# Patient Record
Sex: Male | Born: 2000 | Race: White | Hispanic: No | Marital: Single | Smoking: Never smoker
Health system: Southern US, Community
[De-identification: ages and names within clinical notes are randomized; demographics above are authoritative.]

## PROBLEM LIST (undated history)

## (undated) DIAGNOSIS — F988 Other specified behavioral and emotional disorders with onset usually occurring in childhood and adolescence: Secondary | ICD-10-CM

## (undated) DIAGNOSIS — I471 Supraventricular tachycardia, unspecified: Secondary | ICD-10-CM

## (undated) DIAGNOSIS — R05 Cough: Secondary | ICD-10-CM

## (undated) DIAGNOSIS — J302 Other seasonal allergic rhinitis: Secondary | ICD-10-CM

## (undated) DIAGNOSIS — R7303 Prediabetes: Secondary | ICD-10-CM

## (undated) DIAGNOSIS — J352 Hypertrophy of adenoids: Secondary | ICD-10-CM

## (undated) DIAGNOSIS — J45909 Unspecified asthma, uncomplicated: Secondary | ICD-10-CM

## (undated) DIAGNOSIS — T783XXA Angioneurotic edema, initial encounter: Secondary | ICD-10-CM

## (undated) DIAGNOSIS — L309 Dermatitis, unspecified: Secondary | ICD-10-CM

## (undated) DIAGNOSIS — R0981 Nasal congestion: Secondary | ICD-10-CM

## (undated) HISTORY — PX: ADENOIDECTOMY: SUR15

## (undated) HISTORY — DX: Angioneurotic edema, initial encounter: T78.3XXA

## (undated) HISTORY — DX: Dermatitis, unspecified: L30.9

## (undated) HISTORY — DX: Prediabetes: R73.03

---
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
## 2022-02-16 DIAGNOSIS — K582 Mixed irritable bowel syndrome: Secondary | ICD-10-CM | POA: Diagnosis not present

## 2022-02-17 ENCOUNTER — Encounter: Payer: Self-pay | Admitting: Gastroenterology

## 2022-02-17 ENCOUNTER — Ambulatory Visit (INDEPENDENT_AMBULATORY_CARE_PROVIDER_SITE_OTHER): Payer: Medicaid Other | Admitting: Gastroenterology

## 2022-02-17 VITALS — BP 106/71 | HR 64 | Temp 97.6°F | Ht 73.0 in | Wt 320.3 lb

## 2022-02-17 DIAGNOSIS — K219 Gastro-esophageal reflux disease without esophagitis: Secondary | ICD-10-CM | POA: Diagnosis not present

## 2022-02-17 DIAGNOSIS — R198 Other specified symptoms and signs involving the digestive system and abdomen: Secondary | ICD-10-CM | POA: Diagnosis not present

## 2022-02-17 MED ORDER — PANTOPRAZOLE SODIUM 40 MG PO TBEC: 40.0000 mg | DELAYED_RELEASE_TABLET | Freq: Two times a day (BID) | ORAL | 3 refills | Status: DC

## 2022-02-17 NOTE — Progress Notes (Signed)
? ?GI Office Note   ? ?Referring Provider: Richrd Sox, MD ?Primary Care Physician:  Richrd Sox, MD ?Primary GI: Dr. Marletta Lor ? ?Date:  02/17/2022  ?ID:  Dylan Bush, DOB 2001/02/21, MRN [id number] ? ? ?Chief Complaint  ? ?Chief Complaint  ?Patient presents with  ? Abdominal Pain  ?  Constipation or diarrhea    ? ? ? ?History of Present Illness  ?Dylan Bush is a 21 y.o. male presenting today with a history of ADD, Anxiety, Asthma, SVT, and prediabetes presenting today with complaint of constipation and diarrhea.  ? ?Per review of PCP note, patient has suffered with social anxiety for which he is now being treated for with Buspar and fluoxetine. He has a documented history of gluten intolerance as a child as celiac panel was negative. PCP advised to try for better control of anxiety and then work on dietary and lifestyle changes.  ? ?Patient and mother both providing history.  Patient has had alternating constipation and diarrhea.  He reports that he has predominant diarrhea.  He was multiple times a day with stools being a Bristol 5 or 6.  He also has feeling of incomplete emptying.  He does seldom report some watery, Bristol 7 stools as well.  Reports that most days of the week he is going multiple times in a day, however twice a week he reports constipation not being able to go to the bathroom at all.  Stools are dark green or brown color.  Denies any hematemesis, melena or hematochezia.  He also has lower abdominal pain that sometimes improves with defecation.  He reports he has tried MiraLAX in the past but was never consistent with that.  He was given dicyclomine to take 3 times daily as needed for abdominal cramping by his PCP.  Appointment has been taking this 3 times daily every day since.  He reports his diet is not that great and it is hard to avoid gluten.  His celiac testing has been negative in the past but he has been suspected of being gluten intolerant.  He does report that he will  eat snacks at night including chips, ice cream, or cereal.  He states he mostly uses 2% milk but a lot of times ice cream is regular ice cream.  He reports his symptoms are getting in the way of his work, and his boss has been talking about possibly firing him due to staying in the bathroom too much. ? ?He also continues to complain of GERD symptoms.  He is currently on omeprazole 20 mg twice daily.  He reports it is hard to eat anything in the morning, he has a sour taste in his mouth and occasional vomiting related to the burning sensation in his throat (states he feels like lava).  Denies nausea or upper abdominal pain. ? ?Past Medical History:  ?Diagnosis Date  ? ADD (attention deficit disorder)   ? Adenoid hypertrophy 09/2013  ? Angio-edema   ? Asthma   ? daily and prn inhalers  ? Cough 10/11/2013  ? started antibiotic 10/04/2013 x 10 days  ? Eczema   ? 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 Prediabetes   ? Seasonal allergies   ? Stuffy nose 10/11/2013  ? SVT (supraventricular tachycardia) (HCC)   ? followed yearly by [facility]. Cardiology  ? ? ?Past Surgical History:  ?Procedure Laterality Date  ? ADENOIDECTOMY N/A 10/15/2013  ? Procedure: ADENOIDECTOMY;  Surgeon: Darletta Moll, MD;  Location: [facility];  Service: ENT;  Laterality: N/A;  ? ADENOIDECTOMY    ? ? ?Current Outpatient Medications  ?Medication Sig Dispense Refill  ? busPIRone (BUSPAR) 10 MG tablet Take 10 mg by mouth 3 (three) times daily.    ? dicyclomine (BENTYL) 20 MG tablet Take 20 mg by mouth 4 (four) times daily.    ? fexofenadine (ALLEGRA ALLERGY) 180 MG tablet Take 1 tablet (180 mg total) by mouth daily. 30 tablet 3  ? ibuprofen (ADVIL,MOTRIN) 400 MG tablet Take 1 tablet (400 mg total) by mouth every 6 (six) hours as needed. 30 tablet 0  ? montelukast (SINGULAIR) 10 MG tablet TAKE 1 TABLET BY MOUTH EVERY EVENING AT BEDTIME 30 tablet 0  ? pantoprazole (PROTONIX) 40 MG tablet Take 1 tablet (40 mg total) by mouth 2 (two)  times daily. 90 tablet 3  ? PROAIR HFA 108 (90 Base) MCG/ACT inhaler INHALE 1 TO 2 PUFFS INTO THE LUNGS EVERY 4 HOURS AS NEEDED FOR WHEEZING OR SHORTNESS OF BREATH 18 g 1  ? FLUoxetine (PROZAC) 10 MG capsule Take 10 mg by mouth daily.    ? fluticasone (FLONASE) 50 MCG/ACT nasal spray Place 2 sprays into both nostrils daily. (Patient not taking: Reported on 11/16/2020) 16 g 12  ? ?No current facility-administered medications for this visit.  ? ? ?Allergies as of 02/17/2022  ? (No Known Allergies)  ? ? ?Family History  ?Problem Relation Age of Onset  ? Diabetes Mother   ?     Type I  ? Asthma Mother   ?     as a child  ? Cancer Mother   ?     cervical  ? ADD / ADHD Mother   ? Allergic rhinitis Mother   ? Diabetes Brother   ?     Type I  ? ADD / ADHD Brother   ? Diabetes Maternal Aunt   ?     Type I  ? Diabetes Maternal Uncle   ?     Type I  ? Diabetes Paternal Uncle   ?     Type I  ? Diabetes Maternal Grandmother   ?     Type II  ? Hypertension Maternal Grandmother   ? Diabetes Maternal Grandfather   ?     Type II  ? Hypertension Maternal Grandfather   ? Thyroid disease Cousin   ? Angioedema Neg Hx   ? Atopy Neg Hx   ? Eczema Neg Hx   ? Immunodeficiency Neg Hx   ? Urticaria Neg Hx   ? ? ?Social History  ? ?Socioeconomic History  ? Marital status: Single  ?  Spouse name: Not on file  ? Number of children: Not on file  ? Years of education: Not on file  ? Highest education level: Not on file  ?Occupational History  ? Not on file  ?Tobacco Use  ? Smoking status: Never  ?  Passive exposure: Yes  ? Smokeless tobacco: Never  ? Tobacco comments:  ?  mother smokes outside and in the car with pt.  ?Vaping Use  ? Vaping Use: Never used  ?Substance and Sexual Activity  ? Alcohol use: No  ? Drug use: No  ? Sexual activity: Never  ?Other Topics Concern  ? Not on file  ?Social History Narrative  ? Lives with mom and 2 older brothers. 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  ? ?Social Determinants of Health   ? ?  Financial Resource Strain: Not on file  ?Food Insecurity: Not on file  ?Transportation Needs: Not on file  ?Physical Activity: Not on file  ?Stress: Not on file  ?Social Connections: Not on file  ? ? ? ?Review of Systems  ? ?Gen: Denies fever, chills, anorexia. Denies fatigue, weakness, weight loss.  ?CV: Denies chest pain, palpitations, syncope, peripheral edema, and claudication. ?Resp: Denies dyspnea at rest, cough, wheezing, coughing up blood, and pleurisy. ?GI: see HPI ?Derm: Denies rash, itching, dry skin ?Psych: Denies depression, anxiety, memory loss, confusion. No homicidal or suicidal ideation.  ?Heme: Denies bruising, bleeding, and enlarged lymph nodes. ? ? ?Physical Exam  ? ?BP 106/71   Pulse 64   Temp 97.6 ?F (36.4 ?C) (Temporal)   Ht 6\' 1"  (1.854 m)   Wt (!) 320 lb 4.8 oz (145.3 kg)   BMI 42.26 kg/m?  ? ?General:   Alert and oriented. No distress noted. Pleasant and cooperative.  ?Head:  Normocephalic and atraumatic. ?Eyes:  Conjuctiva clear without scleral icterus. ?Mouth:  Oral mucosa pink and moist. Good dentition. No lesions. ?Lungs:  Clear to auscultation bilaterally. No wheezes, rales, or rhonchi. No distress.  ?Heart:  S1, S2 present without murmurs appreciated.  ?Abdomen:  +BS, soft, mild TTP to lower abdomen, non-distended. No rebound or guarding. No HSM or masses noted. ?Rectal: deferred ?Msk:  Symmetrical without gross deformities. Normal posture. ?Extremities:  Without edema. ?Neurologic:  Alert and  oriented x4 ?Psych:  Alert and cooperative. Normal mood and affect. ? ? ?Assessment  ?Dylan Bush is a 21 y.o. male with a history of ADD, Anxiety, Asthma, SVT, and prediabetes presenting today with GERD and alternating constipation and diarrhea with associated abdominal pain.  ? ?Alternating constipation and diarrhea: Symptoms appear to be predominantly diarrhea based on report.  Reports Bristol 5/6 stools mostly.  Although he reports that this is consistency of his stools,  suspect constipation with overflow diarrhea given incomplete emptying along with abdominal bloating and lower abdominal pain.  Given the variation of his symptoms, suspect he likely has IBS in the setting of his anxiet

## 2022-02-17 NOTE — Patient Instructions (Addendum)
I am attaching education to your visit summary regarding diet changes and things to do as discussed related to acid reflux and IBS.  As discussed I want you to keep a dietary log along with your symptoms so we can identify any food triggers. ? ?This weekend I went to to eat more of a bland diet, and take MiraLAX beginning Friday evening (1 capful), take 1 capful again Saturday morning.  You may have associated abdominal cramping.  Do not take Bentyl.  If you have more than 2-3 large loose/watery bowel movements you may skip the MiraLAX on Sunday.  If diarrhea ensues you may begin taking the Bentyl again but decreased to twice a day rather than 3 times a day for abdominal cramping.  Also begin taking Benefiber 2 teaspoons daily for the next couple weeks and let me know how you are doing. ? ?I sent in pantoprazole 40 mg twice daily, 30 minutes before meals.  If for some reason this medication not covered by her insurance or so expensive, please call the office and let me know and we will try different medication. ? ?It was a pleasure to see you today. I want to create trusting relationships with patients. If you receive a survey regarding your visit,  I greatly appreciate you taking time to fill this out on paper or through your MyChart. I value your feedback. ? ?Brooke Bonito, MSN, FNP-BC, AGACNP-BC ?[facility] ? ? ?

## 2022-03-08 ENCOUNTER — Encounter: Payer: Self-pay | Admitting: Gastroenterology

## 2022-03-08 ENCOUNTER — Ambulatory Visit (INDEPENDENT_AMBULATORY_CARE_PROVIDER_SITE_OTHER): Payer: Medicaid Other | Admitting: Gastroenterology

## 2022-03-08 VITALS — BP 125/77 | HR 85 | Temp 97.0°F | Ht 74.0 in | Wt 310.2 lb

## 2022-03-08 DIAGNOSIS — K580 Irritable bowel syndrome with diarrhea: Secondary | ICD-10-CM | POA: Diagnosis not present

## 2022-03-08 DIAGNOSIS — R197 Diarrhea, unspecified: Secondary | ICD-10-CM

## 2022-03-08 DIAGNOSIS — R1084 Generalized abdominal pain: Secondary | ICD-10-CM | POA: Diagnosis not present

## 2022-03-08 DIAGNOSIS — K219 Gastro-esophageal reflux disease without esophagitis: Secondary | ICD-10-CM

## 2022-03-08 DIAGNOSIS — R109 Unspecified abdominal pain: Secondary | ICD-10-CM | POA: Insufficient documentation

## 2022-03-08 MED ORDER — OMEPRAZOLE 20 MG PO CPDR: 20.0000 mg | DELAYED_RELEASE_CAPSULE | Freq: Two times a day (BID) | ORAL | 3 refills | Status: DC

## 2022-03-08 MED ORDER — HYOSCYAMINE SULFATE 0.125 MG PO TABS: 0.1250 mg | ORAL_TABLET | Freq: Three times a day (TID) | ORAL | 3 refills | Status: DC

## 2022-03-08 NOTE — Progress Notes (Signed)
GI Office Note    Referring Provider: Richrd SoxJohnson, Quan T, MD Primary Care Physician:  Roe RutherfordKeatts, Courtney, NP  Primary Gastroenterologist:Charles K. Marletta Lorarver, DO   Chief Complaint   Chief Complaint  Patient presents with   diarrhea/constipation    Had diarrhea all last week and now has constipation, lower abd cramps at times when constipated. Occas nausea/vomiting    Gastroesophageal Reflux    Wants to go back to omperazole. Worked better     History of Present Illness   Dylan Bush is a 21 y.o. male presenting today for follow-up. He presents with his mother, who [facility].  Patient was last seen February 17, 2022.  Past medical history significant for SVT (followed at [facility] previously but no longer on medication), prediabetes, anxiety/social anxiety, ADD, GERD, suspected IBS.  Previously followed by pediatric GI Dr. Migdalia Dkudra, last seen via video visit in 12/2020. Prior to that [facility]. He has aged out of peds. Suspected IBS-D given "no red flags and negative celiac panel." Recommended obtaining prescreening EKG to rule out prolonged QTC and possibly start low dose Elavil.   Patient complains of ongoing issues with mostly diarrhea, abdominal cramps. Rarely has constipation. No "normal" stools. He has had a lot of stomach issues since age 21. He has missed quite a bit of work due to symptoms. Sometimes gets stuck in the bathroom for awhile due to diarrhea. He had diarrhea all last week. BM 5-10 times per day. [place] 6-7. He has seen [facility] on toilet tissue before. No melena. Rare normal stools. Sometimes he feels constipated. Gets abdominal cramping. Happened couple of nights ago. He will feel urge for BM and when he goes on a little passes and feels incomplete.on occasion has tried Miralax, took several days in a row and did not feel like it helped. Made it "harder" to pass the stool because stool became pasty. Bentyl helps some with abdominal  cramping but not loose stool. Abdominal cramping resolves with BM. Imodium does not help with diarrhea. He has avoided dairy and gluten and does not find this helpful. Notes increased diarrhea with anxiety. Does have a postprandial component. He is on Buspar for social anxiety. 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 His two brothers work there also. He does not want to lose his job although he does say they work well with him and just want him to feel better. He has not used FMLA. States PCP did labs and told him he has gluten "sensitivity" and advised to go gluten free. He did this for awhile and it didn't help. States he had overall cut back on gluten, uses gluten free pasta but still eats regular bread due to expense.   Continues to have issues with acid reflux. Felt like omeprazole was better than pantoprazole. He was switched at last ov. He has had some regurgitation and burning. Some dysphagia. Denies ASA powders.     October 2016: TTG IgA 1, gliadin IgG 7, gliadin IgA 7 all negative. October 2019: IgA 201, TTG IgA less than 1.23 January 2021: Antigliadin IgG weakly positive at 22, ITG/DGP screen negative, ANA negative    Medications   Current Outpatient Medications  Medication Sig Dispense Refill   busPIRone (BUSPAR) 10 MG tablet Take 10 mg by mouth 3 (three) times daily.     dicyclomine (BENTYL) 20 MG tablet Take 20 mg by mouth in the  morning, at noon, and at bedtime.     fexofenadine (ALLEGRA ALLERGY) 180 MG tablet Take 1 tablet (180 mg total) by mouth daily. 30 tablet 3   FLUoxetine (PROZAC) 10 MG capsule Take 10 mg by mouth daily.     ibuprofen (ADVIL,MOTRIN) 400 MG tablet Take 1 tablet (400 mg total) by mouth every 6 (six) hours as needed. 30 tablet 0   montelukast (SINGULAIR) 10 MG tablet TAKE 1 TABLET BY MOUTH EVERY EVENING AT BEDTIME 30 tablet 0   pantoprazole (PROTONIX) 40 MG tablet Take 1 tablet (40 mg total) by mouth 2 (two) times daily. 90 tablet 3    PROAIR HFA 108 (90 Base) MCG/ACT inhaler INHALE 1 TO 2 PUFFS INTO THE LUNGS EVERY 4 HOURS AS NEEDED FOR WHEEZING OR SHORTNESS OF BREATH 18 g 1   fluticasone (FLONASE) 50 MCG/ACT nasal spray Place 2 sprays into both nostrils daily. (Patient not taking: Reported on 11/16/2020) 16 g 12   No current facility-administered medications for this visit.    Allergies   Allergies as of 03/08/2022   (No Known Allergies)      Review of Systems   General: Negative for anorexia, weight loss, fever, chills, fatigue, weakness. Eyes: Negative for vision changes.  ENT: Negative for hoarseness, difficulty swallowing , nasal congestion. CV: Negative for chest pain, angina, palpitations, dyspnea on exertion, peripheral edema.  Respiratory: Negative for dyspnea at rest, dyspnea on exertion, cough, sputum, wheezing.  GI: See history of present illness. GU:  Negative for dysuria, hematuria, urinary incontinence, urinary frequency, nocturnal urination.  MS: Negative for joint pain, low back pain.  Derm: Negative for rash or itching.  Neuro: Negative for weakness, abnormal sensation, seizure, frequent headaches, memory loss,  confusion.  Psych: Negative for depression, suicidal ideation, hallucinations. +anxiety Endo: Negative for unusual weight change.  Heme: Negative for bruising or bleeding. Allergy: Negative for rash or hives.  Physical Exam   BP 125/77   Pulse 85   Temp (!) 97 F (36.1 C)   Ht 6\' 2"  (1.88 m)   Wt (!) 310 lb 3.2 oz (140.7 kg)   BMI 39.83 kg/m    General: obese, young male, pleasant, well-developed in no acute distress. Accompanied by mother.  Head: Normocephalic, atraumatic.   Eyes: Conjunctiva pink, no icterus. Mouth: Oropharyngeal mucosa moist and pink , no lesions erythema or exudate. Neck: Supple without thyromegaly, masses, or lymphadenopathy.  Lungs: Clear to auscultation bilaterally.  Heart: Regular rate and rhythm, no murmurs rubs or gallops.  Abdomen: Bowel sounds  are normal, nontender, nondistended, no hepatosplenomegaly or masses,  no abdominal bruits or hernia, no rebound or guarding. Limited by body habitus. Rectal: not performed  Extremities: No lower extremity edema. No clubbing or deformities.  Neuro: Alert and oriented x 4 , grossly normal neurologically.  Skin: Warm and dry, no rash or jaundice.   Psych: Alert and cooperative, normal mood and affect.  Labs   Lab Results  Component Value Date   TSH 1.950 04/19/2019   Lab Results  Component Value Date   HGBA1C 5.8 (H) 04/19/2019   01/2021: WBC 5100, Hgb 14.3, Platelets 325000, bun 8, cre 0.81, na 140, alb 5, tbili 0.3, AP 108, AST 18, ALT 19  Imaging Studies   No results found.  Assessment   GERD: Felt like he did better on omeprazole than pantoprazole.  He would like to go back on the medication.  Prescription for omeprazole 20 mg twice daily before breakfast and evening meal.  Reinforced  antireflux measures.  He has room for improvement with regards to his diet.  Weight loss would also be helpful for better management of his reflux.  He has some vague dysphagia symptoms.  May improve with changing PPI.  No prior upper endoscopy.  May need to consider in the near future if symptoms do not settle down.  Diarrhea: Patient describes alternating constipation and diarrhea, predominantly diarrhea.  Has had bowel issues for 7 or 8 years at least.  States he has tried dairy free, gluten-free diets without improvement.  Dicyclomine has helped with abdominal cramping but not really with the diarrhea.  No response to Imodium.  Notes a postprandial component as well as related to stress.  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  We offered to fill out FMLA papers if needed or provide work notes but he declined.  There has been concern for gluten sensitivity based on lab work.  I have reviewed labs.  In 2016 when he was 21 years old, TTG  IgA, gliadin IgG, gliadin IgA were all negative.  In 2019 and age 52, TTG IgA and serum IgA were unremarkable.  Last year TTG/DGP screen was negative. Antigliadin IgG was weakly positive although this testing is not recommended to be performed for celiac screening because of high rate of false positivity, 15 to 20%.  Based on labs and lack of symptoms such as malabsorption, it is unlikely that he has celiac disease.  IBD is less likely as well although we will obtain inflammatory markers for screening purposes.  He likely has IBS.  Will not rule out the need for colonoscopy to complete work-up pending lab results.   PLAN   Start omeprazole 20 mg twice daily before first and evening meal Stop Bentyl, start Levsin before meals and at bedtime. Start Benefiber 2 teaspoons daily, 3 to 4 g of fiber daily from supplement. Consume high-fiber foods. Limit dairy products. Labs for CMET, CBC, sed rate, CRP, TTG IgA, IgA Depending on clinical response, he may need upper endoscopy to further evaluate GERD/vague dysphagia and possibly a colonoscopy if no improvement in symptoms. Could consider use of Viberzi now that he is 20.  We will touch base with patient after labs available.  Leanna Battles. Melvyn Neth, MHS, PA-C [facility]

## 2022-03-08 NOTE — Patient Instructions (Signed)
Stop pantoprazole, start omeprazole 20mg  twice daily before breakfast and evening meal. ?Stop bentyl, start levsin. Dissolve one under tongue 30 minutes before meals and at bedtime.  ?Start Benefiber 2 teaspoons daily. You can also use fiber chewable or gummy. Try to get 3-4 grams of fiber daily from supplement. ?Get labs done. We will be in touch with results and next step. ?

## 2022-03-15 DIAGNOSIS — K219 Gastro-esophageal reflux disease without esophagitis: Secondary | ICD-10-CM | POA: Diagnosis not present

## 2022-03-15 DIAGNOSIS — R1084 Generalized abdominal pain: Secondary | ICD-10-CM | POA: Diagnosis not present

## 2022-03-15 DIAGNOSIS — R197 Diarrhea, unspecified: Secondary | ICD-10-CM | POA: Diagnosis not present

## 2022-03-18 LAB — COMPREHENSIVE METABOLIC PANEL
ALT: 20 IU/L (ref 0–44)
AST: 16 IU/L (ref 0–40)
Albumin/Globulin Ratio: 1.6 (ref 1.2–2.2)
Albumin: 4.2 g/dL (ref 4.1–5.2)
Alkaline Phosphatase: 109 IU/L (ref 51–125)
BUN/Creatinine Ratio: 9 (ref 9–20)
BUN: 7 mg/dL (ref 6–20)
Bilirubin Total: <0.2 mg/dL (ref 0.0–1.2)
CO2: 23 mmol/L (ref 20–29)
Calcium: 9.7 mg/dL (ref 8.7–10.2)
Chloride: 105 mmol/L (ref 96–106)
Creatinine, Ser: 0.76 mg/dL (ref 0.76–1.27)
Globulin, Total: 2.7 g/dL (ref 1.5–4.5)
Glucose: 97 mg/dL (ref 70–99)
Potassium: 4.8 mmol/L (ref 3.5–5.2)
Sodium: 141 mmol/L (ref 134–144)
Total Protein: 6.9 g/dL (ref 6.0–8.5)
eGFR: 134 mL/min/{1.73_m2} (ref >59)

## 2022-03-18 LAB — CBC WITH DIFFERENTIAL/PLATELET
Basophils Absolute: 0.1 10*3/uL (ref 0.0–0.2)
Basos: 1 %
EOS (ABSOLUTE): 0.4 10*3/uL (ref 0.0–0.4)
Eos: 5 %
Hematocrit: 41.7 % (ref 37.5–51.0)
Hemoglobin: 13.7 g/dL (ref 13.0–17.7)
Immature Grans (Abs): 0 10*3/uL (ref 0.0–0.1)
Immature Granulocytes: 0 %
Lymphocytes Absolute: 2.3 10*3/uL (ref 0.7–3.1)
Lymphs: 31 %
MCH: 26.9 pg (ref 26.6–33.0)
MCHC: 32.9 g/dL (ref 31.5–35.7)
MCV: 82 fL (ref 79–97)
Monocytes Absolute: 0.5 10*3/uL (ref 0.1–0.9)
Monocytes: 7 %
Neutrophils Absolute: 4.2 10*3/uL (ref 1.4–7.0)
Neutrophils: 56 %
Platelets: 350 10*3/uL (ref 150–450)
RBC: 5.09 x10E6/uL (ref 4.14–5.80)
RDW: 15 % (ref 11.6–15.4)
WBC: 7.5 10*3/uL (ref 3.4–10.8)

## 2022-03-18 LAB — SEDIMENTATION RATE: Sed Rate: 16 mm/hr — ABNORMAL HIGH (ref 0–15)

## 2022-03-18 LAB — TISSUE TRANSGLUTAMINASE, IGA: Transglutaminase IgA: <2 U/mL (ref 0–3)

## 2022-03-18 LAB — C-REACTIVE PROTEIN: CRP: 4 mg/L (ref 0–10)

## 2022-03-18 LAB — IGA: IgA/Immunoglobulin A, Serum: 222 mg/dL (ref 90–386)

## 2022-03-31 ENCOUNTER — Telehealth: Payer: Self-pay

## 2022-03-31 NOTE — Telephone Encounter (Signed)
Pt's mother called wanting to know pt's lab results, states that they are not showing in myChart. Results are in Epic.

## 2022-04-01 NOTE — Telephone Encounter (Signed)
Mychart message sent to adult patient. Should show up in [facility] mychart. See result note.

## 2022-04-05 ENCOUNTER — Telehealth: Payer: Self-pay

## 2022-04-05 DIAGNOSIS — R197 Diarrhea, unspecified: Secondary | ICD-10-CM

## 2022-04-05 DIAGNOSIS — K580 Irritable bowel syndrome with diarrhea: Secondary | ICD-10-CM

## 2022-04-05 MED ORDER — VIBERZI 100 MG PO TABS: 100.0000 mg | ORAL_TABLET | Freq: Two times a day (BID) | ORAL | 2 refills | Status: DC

## 2022-04-05 NOTE — Addendum Note (Signed)
Addended by: Aida Raider on: 04/05/2022 04:27 PM   Modules accepted: Orders

## 2022-04-05 NOTE — Telephone Encounter (Signed)
Mother called stating that the pharmacy did not have viberzi in stock and states that [facility] does, is requesting that the prescription be sent there.

## 2022-04-05 NOTE — Telephone Encounter (Signed)
Prescription sent to [facility] in [place].

## 2022-04-05 NOTE — Addendum Note (Signed)
Addended by: Aida Raider on: 04/05/2022 11:02 AM   Modules accepted: Orders

## 2022-04-06 ENCOUNTER — Telehealth: Payer: Self-pay

## 2022-04-06 NOTE — Telephone Encounter (Signed)
PA for Viberzi 100 MG tablets was approved from 04/06/2022 through 04/07/2023. Approval letter to be scanned into patient's chart.

## 2022-04-22 ENCOUNTER — Encounter: Payer: Self-pay | Admitting: Internal Medicine

## 2022-04-29 ENCOUNTER — Encounter: Payer: Self-pay | Admitting: Gastroenterology

## 2022-04-29 ENCOUNTER — Telehealth: Payer: Self-pay

## 2022-04-29 ENCOUNTER — Telehealth (INDEPENDENT_AMBULATORY_CARE_PROVIDER_SITE_OTHER): Payer: Medicaid Other | Admitting: Gastroenterology

## 2022-04-29 ENCOUNTER — Telehealth: Payer: Self-pay | Admitting: Gastroenterology

## 2022-04-29 VITALS — Ht 73.0 in | Wt 295.0 lb

## 2022-04-29 DIAGNOSIS — K580 Irritable bowel syndrome with diarrhea: Secondary | ICD-10-CM

## 2022-04-29 NOTE — Telephone Encounter (Signed)
Dylan Bush, you are scheduled for a virtual visit with your provider today.  Just as we do with appointments in the office, we must obtain your consent to participate.  Your consent will be active for this visit and any virtual visit you may have with one of our providers in the next 365 days.  If you have a MyChart account, I can also send a copy of this consent to you electronically.  All virtual visits are billed to your insurance company just like a traditional visit in the office.  As this is a virtual visit, video technology does not allow for your provider to perform a traditional examination.  This may limit your provider's ability to fully assess your condition.  If your provider identifies any concerns that need to be evaluated in person or the need to arrange testing such as labs, EKG, etc, we will make arrangements to do so.  Although advances in technology are sophisticated, we cannot ensure that it will always work on either your end or our end.  If the connection with a video visit is poor, we may have to switch to a telephone visit.  With either a video or telephone visit, we are not always able to ensure that we have a secure connection.   I need to obtain your verbal consent now.   Are you willing to proceed with your visit today? Patient consented.

## 2022-04-29 NOTE — Telephone Encounter (Addendum)
Tried to connect with patient at 10:35 but patient never got onto virtual visit. Called number provided, Mom's number. She said she called and cancelled appt because he had to go to the bathroom and could not come out to talk. Appt was at 10:00 and she called to cancel at 10:35. I offered to complete appt now or make another appt for Monday, in person or virtual but she said she would have to call back. Per mom, he had been taking viberzi for two weeks and no improvement. Also scared of side effects. He will likely need a colonoscopy +/- egd (depending on how his reflux is going). Would advise in person or virtual appt to discuss and she said she would let him know and that she agrees with plan.

## 2022-04-29 NOTE — Progress Notes (Signed)
Tried to connect with patient but never got onto virtual visit. Called number provided, Mom's number. She said she called and cancelled appt because he had to go to the bathroom and could not come out to talk. I offered to make another appt for Monday, in person or virtual but she said she would have to call back. Per mom, he had been taking viberzi for two weeks and no improvement. Also scared of side effects. He will likely need a colonoscopy +/- egd (depending on how his reflux is going). Would advise in person or virtual appt to discuss and she said she would let him know and that she agrees with plan.

## 2022-05-03 ENCOUNTER — Telehealth (INDEPENDENT_AMBULATORY_CARE_PROVIDER_SITE_OTHER): Payer: Medicaid Other | Admitting: Gastroenterology

## 2022-05-03 ENCOUNTER — Telehealth: Payer: Self-pay | Admitting: *Deleted

## 2022-05-03 ENCOUNTER — Encounter: Payer: Self-pay | Admitting: Gastroenterology

## 2022-05-03 VITALS — Ht 73.0 in | Wt 295.0 lb

## 2022-05-03 DIAGNOSIS — R197 Diarrhea, unspecified: Secondary | ICD-10-CM

## 2022-05-03 DIAGNOSIS — K219 Gastro-esophageal reflux disease without esophagitis: Secondary | ICD-10-CM

## 2022-05-03 MED ORDER — CLENPIQ 10-3.5-12 MG-GM -GM/175ML PO SOLN: 1.0000 | ORAL | 0 refills | Status: DC

## 2022-05-03 NOTE — Progress Notes (Signed)
Primary Care Physician:  Roe Rutherford, NP Primary GI:  Hennie Duos. Marletta Lor, DO Patient Location: Home Provider Location: [facility] office  Reason for Visit:  Chief Complaint  Patient presents with   Diarrhea    Persons present on the virtual encounter, with roles: Patient, myself (provider), patient's mother Verdie Mosher, CMA (updated meds and allergies)  Total time (minutes) spent on medical discussion: 15 minutes  Due to COVID-19, visit was conducted using Mychart video method.  Visit was requested by patient.  Virtual Visit via Mychart video  I connected with Dylan Bush on 05/03/22 at 11:30 AM EDT by Mychart video and verified that I am speaking with the correct person using two identifiers.   I discussed the limitations, risks, security and privacy concerns of performing an evaluation and management service by telephone/video and the availability of in person appointments. I also discussed with the patient that there may be a patient responsible charge related to this service. The patient expressed understanding and agreed to proceed.   HPI:   Dylan Bush is a 21 y.o. male who presents for virtual visit regarding follow-up.  He was seen in the office in May 2023.  His past medical history significant for SVT, prediabetes, anxiety/social anxiety, ADD, GERD, suspected IBS.  Previously followed by pediatric GI, Dr. Migdalia Dk, last seen via video visit in 12/2020. Prior to that seen by pediatric GI at [facility]. He has aged out of peds. Pediatric GI suspected IBS-D given "no red flags and negative celiac panel." Recommended obtaining prescreening EKG to rule out prolonged QTC and possibly start low dose Elavil.   October 2016: TTG IgA 1, gliadin IgG 7, gliadin IgA 7 all negative. October 2019: IgA 201, TTG IgA less than 1.23 January 2021: Antigliadin IgG weakly positive at 22, ITG/DGP screen negative, ANA negative. May 2023: TTG IgA less than 2, IgA 222, CRP 4, sed  rate 16, white blood cell count 7500, hemoglobin 13.7, platelets 350,000, LFTs normal.  At last office visit his pantoprazole was switched to omeprazole 20 mg twice daily, he felt that previously omeprazole worked better.  Bentyl was stopped, Levsin started.  We added Benefiber as well.  He called in with persistent complaints of diarrhea and abdominal cramping.  He was started on Viberzi 100 mg twice daily on June 14.    Today:  Patient states he has been out of work for four weeks on medical leave. States his job took him out. He was supposed to go back to work today but he is not sure if he can. He did not find bentyl of levsin very effective. Bentyl did help with cramps but not the diarrhea. He does have postprandial component to his symptoms. Abdominal cramping improves after diarrhea. We started him on Viberzi, he states he took it for about a week. He is really worried about the side effects. He did not some improvement. His heartburn is doing alot better. No dysphagia. Occasional nausea. No vomiting. His typical days starts with a lot of abdominal cramping and diarrhea. Mornings and evenings are the worse. Sometimes wakes up due to symptoms. Having 5-10 loose stools daily on bad days. Gets stuck in the bathroom for awhile. Hemorrhoids flare with the diarrhea. Today he has had a lot of cramping and feels like he can't have a bowel movement.     Current Outpatient Medications  Medication Sig Dispense Refill   busPIRone (BUSPAR) 10 MG tablet Take 10 mg by mouth  3 (three) times daily.     Eluxadoline (VIBERZI) 100 MG TABS Take 1 tablet (100 mg total) by mouth in the morning and at bedtime. (Patient not taking: Reported on 04/29/2022) 60 tablet 2   fexofenadine (ALLEGRA ALLERGY) 180 MG tablet Take 1 tablet (180 mg total) by mouth daily. 30 tablet 3   FLUoxetine (PROZAC) 10 MG capsule Take 10 mg by mouth daily.     ibuprofen (ADVIL,MOTRIN) 400 MG tablet Take 1 tablet (400 mg total) by mouth every 6  (six) hours as needed. 30 tablet 0   montelukast (SINGULAIR) 10 MG tablet TAKE 1 TABLET BY MOUTH EVERY EVENING AT BEDTIME 30 tablet 0   omeprazole (PRILOSEC) 20 MG capsule Take 1 capsule (20 mg total) by mouth 2 (two) times daily before a meal. 60 capsule 3   PROAIR HFA 108 (90 Base) MCG/ACT inhaler INHALE 1 TO 2 PUFFS INTO THE LUNGS EVERY 4 HOURS AS NEEDED FOR WHEEZING OR SHORTNESS OF BREATH 18 g 1   No current facility-administered medications for this visit.    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 Cardiology    Past Surgical History:  Procedure Laterality Date   ADENOIDECTOMY N/A 10/15/2013   Procedure: ADENOIDECTOMY;  Surgeon: Darletta Moll, MD;  Location: [facility];  Service: ENT;  Laterality: N/A;   ADENOIDECTOMY      Family History  Problem Relation Age of Onset   Diabetes Mother        Type I   Asthma Mother        as a child   Cancer Mother        cervical   ADD / ADHD Mother    Allergic rhinitis Mother    Diabetes Brother        Type I   ADD / ADHD Brother    Diabetes Maternal Aunt        Type I   Diabetes Maternal Uncle        Type I   Diabetes Paternal Uncle        Type I   Diabetes Maternal Grandmother        Type II   Hypertension Maternal Grandmother    Diabetes Maternal Grandfather        Type II   Hypertension Maternal Grandfather    Thyroid disease Cousin    Angioedema Neg Hx    Atopy Neg Hx    Eczema Neg Hx    Immunodeficiency Neg Hx    Urticaria Neg Hx     Social History   Socioeconomic History   Marital status: Single    Spouse name: Not on file    Number of children: Not on file   Years of education: Not on file   Highest education level: Not on file  Occupational History   Not on file  Tobacco Use   Smoking status: Never    Passive exposure: Yes   Smokeless tobacco: Never   Tobacco comments:    mother smokes outside and in the car with  pt.  Vaping Use   Vaping Use: Never used  Substance and Sexual Activity   Alcohol use: No   Drug use: No   Sexual activity: Never  Other Topics Concern   Not on file  Social History Narrative   Lives with mom and 2 older brothers. 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   Social Determinants of Health   Financial Resource Strain: Not on file  Food Insecurity: Not on file  Transportation Needs: Not on file  Physical Activity: Not on file  Stress: Not on file  Social Connections: Not on file  Intimate Partner Violence: Not on file      ROS:  General: Negative for anorexia, weight loss, fever, chills, fatigue, weakness. Eyes: Negative for vision changes.  ENT: Negative for hoarseness, difficulty swallowing, nasal congestion. CV: Negative for chest pain, angina, palpitations, dyspnea on exertion, peripheral edema.  Respiratory: Negative for dyspnea at rest, dyspnea on exertion, cough, sputum, wheezing.  GI: See history of present illness. GU:  Negative for dysuria, hematuria, urinary incontinence, urinary frequency, nocturnal urination.  MS: Negative for joint pain, low back pain.  Derm: Negative for rash or itching.  Neuro: Negative for weakness, abnormal sensation, seizure, frequent headaches, memory loss, confusion.  Psych: Negative for  depression, suicidal ideation, hallucinations. +anxiety Endo: Negative for unusual weight change.  Heme: Negative for bruising or bleeding. Allergy: Negative for rash or hives.   Observations/Objective: Self reported weight of 295 pounds.  Recorded weight in office 310 pounds in 02/2022. Present with his mother. Looks to  mother for assistance in answering some questions but otherwise provides majority of his history. Looks comfortable. Good color. No SOB or distress. Appropriate, alert and oriented.  Assessment and Plan:  Diarrhea/abdominal cramping: chronic symptoms for at least 7-8 years. Controlling his life. Has been out of work for one month, his job suggested he take medical leave. He has failed to improve with dairy free, gluten free diets in the past. Bentyl helped with cramps but not stools. Levsin did not help. Celiac screen recently negative. Previously PCP documented weakly positive Antigliadin IgG although this testing is not recommended to be performed for celiac screening because of high rate of false positivity, 15 to 20%. Based on recent labs (TTG IgA normal) and lack of symptoms such as malabsorption, it is unlikely that he has celiac disease. Sed rate one point above normal but CRP negative. Doubt he has infectious etiology given chronicity of symptoms. I suspect symptoms due to IBS-D. Would offer colonoscopy to rule out other etiologies given severity of his symptoms.   Colonoscopy with Dr. Marletta Lor in near future. ASA 2.  I have discussed the risks, alternatives, benefits with regards to but not limited to the risk of reaction to medication, bleeding, infection, perforation and the patient is agreeable to proceed. Written consent to be obtained. Go back on Viberzi 100mg  BID for next few weeks to see if will make any difference in his symptoms. Discussed no etoh use. Continue omeprazole.  Call with any change in symptoms.     Follow Up Instructions:    I discussed the assessment and treatment plan with the patient. The patient was provided an opportunity to ask questions and all were answered. The patient agreed with the plan and demonstrated an understanding of the instructions. AVS mailed to patient's home address.   The patient was advised to call back or seek an in-person evaluation if the  symptoms worsen or if the  condition fails to improve as anticipated.  I provided 15 minutes of virtual face-to-face time during this encounter.   Tana Coast, PA-C

## 2022-05-03 NOTE — Patient Instructions (Signed)
Go back on Viberzi 100mg  BID. Try to take consistently for next few weeks and give it a chance to work. Do not drink alcohol due to risk of pancreatitis in this setting. Continue omeprazole 20mg  twice daily for your acid reflux. Colonoscopy to be scheduled in the near future.

## 2022-05-03 NOTE — Telephone Encounter (Signed)
Called pt. Scheduled for TCS with Dr. Marletta Lor ASA 2 on 8/1 at 1:45pm. Aware will send instructions to mychart and rx to pharmacy

## 2022-05-11 MED ORDER — HYOSCYAMINE SULFATE 0.125 MG PO TABS: 0.1250 mg | ORAL_TABLET | Freq: Two times a day (BID) | ORAL | 0 refills | Status: AC | PRN

## 2022-05-11 NOTE — Addendum Note (Signed)
Addended by: Tiffany Kocher on: 05/11/2022 06:57 AM   Modules accepted: Orders

## 2022-05-19 ENCOUNTER — Ambulatory Visit: Payer: Medicaid Other | Admitting: Gastroenterology

## 2022-05-24 ENCOUNTER — Telehealth: Payer: Medicaid Other | Admitting: Gastroenterology

## 2022-05-24 ENCOUNTER — Telehealth: Payer: Self-pay | Admitting: *Deleted

## 2022-05-24 NOTE — Telephone Encounter (Signed)
Called pt spoke with mom. Rescheduled to 8/29 at 1:30pm. Aware will mail new instructions. Already has prep at home. Message sent to endo

## 2022-05-24 NOTE — Telephone Encounter (Signed)
-----   Message from Nobie Putnam sent at 05/24/2022  8:14 AM EDT ----- This patient's Mom called and stated she had LM at office to ask questions but did not get a call back and the patient did not stop meds and ate and drank.  I have pulled him into the depot, just let me know what to do with him.  Thanks,

## 2022-06-06 NOTE — Telephone Encounter (Signed)
Thank you :)

## 2022-06-06 NOTE — Telephone Encounter (Signed)
Called patient to notify that he needed to be seen in order to continue more refills. Mother stated that they will no longer come back to our practice and that they will be getting medications from PCP.

## 2022-06-17 ENCOUNTER — Telehealth: Payer: Self-pay | Admitting: *Deleted

## 2022-06-17 NOTE — Telephone Encounter (Signed)
Called pt to offer sooner appt time on Tuesday for procedure but did not want to move up

## 2022-06-21 ENCOUNTER — Ambulatory Visit (HOSPITAL_COMMUNITY)
Admission: RE | Admit: 2022-06-21 | Discharge: 2022-06-21 | Disposition: A | Discharge status: Home / Self Care | Payer: Medicaid Other | Attending: Internal Medicine | Admitting: Internal Medicine

## 2022-06-21 ENCOUNTER — Encounter (HOSPITAL_COMMUNITY): Payer: Self-pay

## 2022-06-21 ENCOUNTER — Encounter (HOSPITAL_COMMUNITY)
Admission: RE | Disposition: A | Discharge status: Home / Self Care | Payer: Self-pay | Source: Home / Self Care | Attending: Internal Medicine

## 2022-06-21 ENCOUNTER — Ambulatory Visit (HOSPITAL_COMMUNITY): Payer: Medicaid Other | Admitting: Anesthesiology

## 2022-06-21 ENCOUNTER — Other Ambulatory Visit: Payer: Self-pay

## 2022-06-21 ENCOUNTER — Ambulatory Visit (HOSPITAL_BASED_OUTPATIENT_CLINIC_OR_DEPARTMENT_OTHER): Payer: Medicaid Other | Admitting: Anesthesiology

## 2022-06-21 DIAGNOSIS — K219 Gastro-esophageal reflux disease without esophagitis: Secondary | ICD-10-CM | POA: Insufficient documentation

## 2022-06-21 DIAGNOSIS — K6389 Other specified diseases of intestine: Secondary | ICD-10-CM | POA: Diagnosis not present

## 2022-06-21 DIAGNOSIS — K649 Unspecified hemorrhoids: Secondary | ICD-10-CM | POA: Diagnosis not present

## 2022-06-21 DIAGNOSIS — K648 Other hemorrhoids: Secondary | ICD-10-CM | POA: Diagnosis not present

## 2022-06-21 DIAGNOSIS — J45909 Unspecified asthma, uncomplicated: Secondary | ICD-10-CM | POA: Insufficient documentation

## 2022-06-21 DIAGNOSIS — K529 Noninfective gastroenteritis and colitis, unspecified: Secondary | ICD-10-CM

## 2022-06-21 HISTORY — PX: BIOPSY: SHX5522

## 2022-06-21 HISTORY — PX: COLONOSCOPY WITH PROPOFOL: SHX5780

## 2022-06-21 SURGERY — COLONOSCOPY WITH PROPOFOL
Anesthesia: General

## 2022-06-21 MED ORDER — PROPOFOL 500 MG/50ML IV EMUL
INTRAVENOUS | Status: DC | PRN
  Administered 2022-06-21: 150 ug/kg/min via INTRAVENOUS

## 2022-06-21 MED ORDER — GLYCOPYRROLATE 0.2 MG/ML IJ SOLN
INTRAMUSCULAR | Status: DC | PRN
  Administered 2022-06-21: .2 mg via INTRAVENOUS

## 2022-06-21 MED ORDER — PROPOFOL 10 MG/ML IV BOLUS
INTRAVENOUS | Status: DC | PRN
  Administered 2022-06-21 (×3): 50 mg via INTRAVENOUS
  Administered 2022-06-21: 100 mg via INTRAVENOUS
  Administered 2022-06-21: 50 mg via INTRAVENOUS

## 2022-06-21 MED ORDER — LIDOCAINE HCL 1 % IJ SOLN
INTRAMUSCULAR | Status: DC | PRN
  Administered 2022-06-21: 50 mg via INTRADERMAL

## 2022-06-21 MED ORDER — LACTATED RINGERS IV SOLN: INTRAVENOUS | Status: DC

## 2022-06-21 NOTE — H&P (Signed)
Primary Care Physician:  Pablo Lawrence, NP Primary Gastroenterologist:  Dr. Abbey Chatters  Pre-Procedure History & Physical: HPI:  Dylan Bush is a 21 y.o. male is here for a colonoscopy to be performed chronic unexplained diarrhea, abdominal cramping.   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 Cardiology    Past Surgical History:  Procedure Laterality Date   ADENOIDECTOMY N/A 10/15/2013   Procedure: ADENOIDECTOMY;  Surgeon: Ascencion Dike, MD;  Location: [facility];  Service: ENT;  Laterality: N/A;   ADENOIDECTOMY      Prior to Admission medications   Medication Sig Start Date End Date Taking? Authorizing Provider  ASHWAGANDHA PO Take 920 mg by mouth daily.   Yes [provider]  busPIRone (BUSPAR) 10 MG tablet Take 10 mg by mouth 3 (three) times daily. 02/03/22  Yes [provider]  diphenhydrAMINE (BENADRYL) 25 MG tablet Take 25 mg by mouth every 6 (six) hours as needed for allergies.   Yes [provider]  Eluxadoline (VIBERZI) 100 MG TABS Take 1 tablet (100 mg total) by mouth in the morning and at bedtime. 04/05/22  Yes Mahon, Lenise Arena, NP  fexofenadine (ALLEGRA ALLERGY) 180 MG tablet Take 1 tablet (180 mg total) by mouth daily. 01/20/21  Yes Althea Charon, FNP  FLUoxetine (PROZAC) 10 MG capsule Take 10 mg by mouth daily. 01/20/22  Yes [provider]  ibuprofen (ADVIL) 200 MG tablet Take 400 mg by mouth every 6 (six) hours as needed for moderate pain.   Yes [provider]  loperamide (IMODIUM A-D) 2 MG tablet Take 2-4 mg by mouth 4 (four) times daily  as needed for diarrhea or loose stools.   Yes [provider]  montelukast (SINGULAIR) 10 MG tablet TAKE 1 TABLET BY MOUTH EVERY EVENING AT BEDTIME 01/27/21  Yes Althea Charon, FNP  omeprazole (PRILOSEC) 20 MG capsule Take 1 capsule (20 mg total) by mouth 2 (two) times daily before a meal. 03/08/22  Yes Mahala Menghini, PA-C  Probiotic Product (PROBIOTIC PO) Take 1 capsule by mouth at bedtime.   Yes [provider]  Sod Picosulfate-Mag Ox-Cit Acd (CLENPIQ) 10-3.5-12 MG-GM -GM/175ML SOLN Take 1 kit by mouth as directed. 05/03/22  Yes Huntleigh Doolen, Elon Alas, DO  hyoscyamine (LEVSIN) 0.125 MG tablet Take 1 tablet (0.125 mg total) by mouth 2 (two) times daily as needed for cramping. Do not take if constipation. Patient not taking: Reported on 05/19/2022 05/11/22   Mahala Menghini, PA-C  PROAIR HFA 108 [phone] Base) MCG/ACT inhaler INHALE 1 TO 2 PUFFS INTO THE LUNGS EVERY 4 HOURS AS NEEDED FOR WHEEZING OR SHORTNESS OF BREATH 08/09/21   Althea Charon, FNP    Allergies as of 05/03/2022   (No Known Allergies)    Family History  Problem Relation Age of Onset   Diabetes Mother        Type I   Asthma Mother        as a child   Cancer Mother        cervical   ADD /  ADHD Mother    Allergic rhinitis Mother    Diabetes Brother        Type I   ADD / ADHD Brother    Diabetes Maternal Aunt        Type I   Diabetes Maternal Uncle        Type I   Diabetes Paternal Uncle        Type I   Diabetes Maternal Grandmother        Type II   Hypertension Maternal Grandmother    Diabetes Maternal Grandfather        Type II   Hypertension Maternal Grandfather    Thyroid disease Cousin    Angioedema Neg Hx    Atopy Neg Hx    Eczema Neg Hx    Immunodeficiency Neg Hx    Urticaria Neg Hx     Social History   Socioeconomic History   Marital status: Single    Spouse name: Not on file   Number of children: Not on file   Years of education: Not on file   Highest education level: Not on file   Occupational History   Not on file  Tobacco Use   Smoking status: Never    Passive exposure: Yes   Smokeless tobacco: Never   Tobacco comments:    mother smokes outside and in the car with pt.  Vaping Use   Vaping Use: Never used  Substance and Sexual Activity   Alcohol use: No   Drug use: No   Sexual activity: Never  Other Topics Concern   Not on file  Social History Narrative   Lives with mom and 2 older brothers. 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   Social Determinants of Health   Financial Resource Strain: Not on file  Food Insecurity: Not on file  Transportation Needs: Not on file  Physical Activity: Not on file  Stress: Not on file  Social Connections: Not on file  Intimate Partner Violence: Not on file    Review of Systems: See HPI, otherwise negative ROS  Physical Exam: Vital signs in last 24 hours: Temp:  [97.8 F (36.6 C)] 97.8 F (36.6 C) (08/29 1207) Pulse Rate:  [106] 106 (08/29 1207) BP: (119)/(70) 119/70 (08/29 1208) SpO2:  [96 %] 96 % (08/29 1207) Weight:  [136.1 kg] 136.1 kg (08/29 1207)   General:   Alert,  Well-developed, well-nourished, pleasant and cooperative in NAD Head:  Normocephalic and atraumatic. Eyes:  Sclera clear, no icterus.   Conjunctiva pink. Ears:  Normal auditory acuity. Nose:  No deformity, discharge,  or lesions. Mouth:  No deformity or lesions, dentition normal. Neck:  Supple; no masses or thyromegaly. Lungs:  Clear throughout to auscultation.   No wheezes, crackles, or rhonchi. No acute distress. Heart:  Regular rate and rhythm; no murmurs, clicks, rubs,  or gallops. Abdomen:  Soft, nontender and nondistended. No masses, hepatosplenomegaly or hernias noted. Normal bowel sounds, without guarding, and without rebound.   Msk:  Symmetrical without gross deformities. Normal posture. Extremities:  Without clubbing or edema. Neurologic:  Alert and  oriented x4;  grossly normal neurologically. Skin:   Intact without significant lesions or rashes. Cervical Nodes:  No significant cervical adenopathy. Psych:  Alert and cooperative. Normal mood and affect.  Impression/Plan: WALKER SITAR is here for a colonoscopy to be performed chronic unexplained diarrhea, abdominal cramping.   The risks of the procedure including infection, bleed, or perforation as well as  benefits, limitations, alternatives and imponderables have been reviewed with the patient. Questions have been answered. All parties agreeable.

## 2022-06-21 NOTE — Discharge Instructions (Signed)
  Colonoscopy Discharge Instructions  Read the instructions outlined below and refer to this sheet in the next few weeks. These discharge instructions provide you with general information on caring for yourself after you leave the hospital. Your doctor may also give you specific instructions. While your treatment has been planned according to the most current medical practices available, unavoidable complications occasionally occur.   ACTIVITY You may resume your regular activity, but move at a slower pace for the next 24 hours.  Take frequent rest periods for the next 24 hours.  Walking will help get rid of the air and reduce the bloated feeling in your belly (abdomen).  No driving for 24 hours (because of the medicine (anesthesia) used during the test).   Do not sign any important legal documents or operate any machinery for 24 hours (because of the anesthesia used during the test).  NUTRITION Drink plenty of fluids.  You may resume your normal diet as instructed by your doctor.  Begin with a light meal and progress to your normal diet. Heavy or fried foods are harder to digest and may make you feel sick to your stomach (nauseated).  Avoid alcoholic beverages for 24 hours or as instructed.  MEDICATIONS You may resume your normal medications unless your doctor tells you otherwise.  WHAT YOU CAN EXPECT TODAY Some feelings of bloating in the abdomen.  Passage of more gas than usual.  Spotting of blood in your stool or on the toilet paper.  IF YOU HAD POLYPS REMOVED DURING THE COLONOSCOPY: No aspirin products for 7 days or as instructed.  No alcohol for 7 days or as instructed.  Eat a soft diet for the next 24 hours.  FINDING OUT THE RESULTS OF YOUR TEST Not all test results are available during your visit. If your test results are not back during the visit, make an appointment with your caregiver to find out the results. Do not assume everything is normal if you have not heard from your  caregiver or the medical facility. It is important for you to follow up on all of your test results.  SEEK IMMEDIATE MEDICAL ATTENTION IF: You have more than a spotting of blood in your stool.  Your belly is swollen (abdominal distention).  You are nauseated or vomiting.  You have a temperature over 101.  You have abdominal pain or discomfort that is severe or gets worse throughout the day.   Overall, your colon looked healthy.  I did not see any active inflammation indicative of underlying inflammatory bowel disease such as ulcerative colitis or Crohn's disease.  I did take biopsies throughout your colon as well as the end portion of your small bowel where Crohn's disease tends to start.  Await pathology results, my office will contact you.  Continue on Viberzi.  Follow-up with GI in 3 to 4 months.  Repeat colonoscopy at age 21.  I hope you have a great rest of your week!  Hennie Duos. Marletta Lor, D.O. Gastroenterology and Hepatology [facility]

## 2022-06-21 NOTE — Op Note (Signed)
[facility] Patient Name: Dylan Bush Procedure Date: 06/21/2022 1:12 PM MRN: [id number] Date of Birth: [birth year] Attending MD: Elon Alas. Abbey Chatters DO CSN: [id number] Age: 21 Admit Type: Outpatient Procedure:                Colonoscopy Indications:              Chronic diarrhea Providers:                Elon Alas. Abbey Chatters, DO, Caprice Kluver, [place]                            Risa Grill, Technician, Bethel Born,                            [employer]chant [employer] officer Referring MD:              Medicines:                See the Anesthesia note for documentation of the                            administered medications Complications:            No immediate complications. Estimated Blood Loss:     Estimated blood loss was minimal. Procedure:                Pre-Anesthesia Assessment:                           - The anesthesia plan was to use monitored                            anesthesia care (MAC).                           After obtaining informed consent, the colonoscope                            was passed under direct vision. Throughout the                            procedure, the patient's blood pressure, pulse, and                            oxygen saturations were monitored continuously. The                            [id number]-HQ190L ([id number]) scope was introduced through                            the anus and advanced to the the cecum, identified                            by appendiceal orifice and ileocecal valve. The                            colonoscopy was performed without difficulty. The  patient tolerated the procedure well. The quality                            of the bowel preparation was evaluated using the                            BBPS [facility] Bowel Preparation Scale) with scores                            of: Right Colon = 3, Transverse Colon = 3 and Left                            Colon = 3 (entire mucosa seen well with no residual                             staining, small fragments of stool or opaque                            liquid). The total BBPS score equals 9. Scope In: 1:31:49 PM Scope Out: 1:42:58 PM Scope Withdrawal Time: 0 hours 9 minutes 17 seconds  Total Procedure Duration: 0 hours 11 minutes 9 seconds  Findings:      The perianal and digital rectal examinations were normal.      Non-bleeding internal hemorrhoids were found during endoscopy.      The terminal ileum appeared normal. Biopsies were taken with a cold       forceps for histology.      Biopsies for histology were taken with a cold forceps from the ascending       colon, transverse colon and descending colon for evaluation of       microscopic colitis. Impression:               - Non-bleeding internal hemorrhoids.                           - The examined portion of the ileum was normal.                            Biopsied.                           - Biopsies were taken with a cold forceps from the                            ascending colon, transverse colon and descending                            colon for evaluation of microscopic colitis. Moderate Sedation:      Per Anesthesia Care Recommendation:           - Patient has a contact number available for                            emergencies. The signs and symptoms of potential  delayed complications were discussed with the                            patient. Return to normal activities tomorrow.                            Written discharge instructions were provided to the                            patient.                           - Resume previous diet.                           - Continue present medications.                           - Await pathology results.                           - Repeat colonoscopy at age 89 or sooner if higher                            risk for screening purposes.                           - Return to GI clinic in 3  months.                           - Continue Viberzi, patient notes this is helping Procedure Code(s):        --- Professional ---                           [phone], Colonoscopy, flexible; with biopsy, single                            or multiple Diagnosis Code(s):        --- Professional ---                           K64.8, Other hemorrhoids                           K52.9, Noninfective gastroenteritis and colitis,                            unspecified CPT copyright 2019 American Medical Association. All rights reserved. The codes documented in this report are preliminary and upon coder review may  be revised to meet current compliance requirements. Elon Alas. Abbey Chatters, DO [place] Abbey Chatters, DO 06/21/2022 1:46:08 PM This report has been signed electronically. Number of Addenda: 0

## 2022-06-21 NOTE — Transfer of Care (Signed)
Immediate Anesthesia Transfer of Care Note  Patient: Dylan Bush  Procedure(s) Performed: COLONOSCOPY WITH PROPOFOL BIOPSY  Patient Location: Short Stay  Anesthesia Type:General  Level of Consciousness: awake  Airway & Oxygen Therapy: Patient Spontanous Breathing  Post-op Assessment: Report given to RN  Post vital signs: Reviewed and stable  Last Vitals:  Vitals Value Taken Time  BP    Temp    Pulse    Resp    SpO2      Last Pain:  Vitals:   06/21/22 1323  TempSrc:   PainSc: 0-No pain      Patients Stated Pain Goal: 7 (06/21/22 1207)  Complications: No notable events documented.

## 2022-06-21 NOTE — Anesthesia Preprocedure Evaluation (Signed)
Anesthesia Evaluation  Patient identified by MRN, date of birth, ID band Patient awake    Reviewed: Allergy & Precautions, H&P , NPO status , Patient's Chart, lab work & pertinent test results, reviewed documented beta blocker date and time   Airway Mallampati: II  TM Distance: >3 FB Neck ROM: full    Dental no notable dental hx.    Pulmonary asthma ,    Pulmonary exam normal breath sounds clear to auscultation       Cardiovascular Exercise Tolerance: Good negative cardio ROS   Rhythm:regular Rate:Normal     Neuro/Psych PSYCHIATRIC DISORDERS negative neurological ROS     GI/Hepatic Neg liver ROS, GERD  Medicated,  Endo/Other  negative endocrine ROS  Renal/GU negative Renal ROS  negative genitourinary   Musculoskeletal   Abdominal   Peds  Hematology negative hematology ROS (+)   Anesthesia Other Findings   Reproductive/Obstetrics negative OB ROS                             Anesthesia Physical Anesthesia Plan  ASA: 2  Anesthesia Plan: General   Post-op Pain Management:    Induction:   PONV Risk Score and Plan: Propofol infusion  Airway Management Planned:   Additional Equipment:   Intra-op Plan:   Post-operative Plan:   Informed Consent: I have reviewed the patients History and Physical, chart, labs and discussed the procedure including the risks, benefits and alternatives for the proposed anesthesia with the patient or authorized representative who has indicated his/her understanding and acceptance.     Dental Advisory Given  Plan Discussed with: CRNA  Anesthesia Plan Comments:         Anesthesia Quick Evaluation

## 2022-06-22 DIAGNOSIS — R1084 Generalized abdominal pain: Secondary | ICD-10-CM

## 2022-06-22 DIAGNOSIS — R197 Diarrhea, unspecified: Secondary | ICD-10-CM

## 2022-06-23 LAB — SURGICAL PATHOLOGY

## 2022-06-23 NOTE — Anesthesia Postprocedure Evaluation (Signed)
Anesthesia Post Note  Patient: Dylan Bush  Procedure(s) Performed: COLONOSCOPY WITH PROPOFOL BIOPSY  Patient location during evaluation: Phase II Anesthesia Type: General Level of consciousness: awake Pain management: pain level controlled Vital Signs Assessment: post-procedure vital signs reviewed and stable Respiratory status: spontaneous breathing and respiratory function stable Cardiovascular status: blood pressure returned to baseline and stable Postop Assessment: no headache and no apparent nausea or vomiting Anesthetic complications: no Comments: Late entry   No notable events documented.   Last Vitals:  Vitals:   06/21/22 1208 06/21/22 1352  BP: 119/70 116/68  Pulse:  77  Resp:  12  Temp:  36.5 C  SpO2:  95%    Last Pain:  Vitals:   06/21/22 1352  TempSrc: Oral  PainSc: 0-No pain                 Windell Norfolk

## 2022-06-24 ENCOUNTER — Encounter (HOSPITAL_COMMUNITY): Payer: Self-pay | Admitting: Internal Medicine

## 2022-06-27 NOTE — Telephone Encounter (Signed)
His colonoscopy was unremarkable.  Previously screened for celiac disease.   Only other thing I could offer him to evaluate his diarrhea are the following.   -->Lab for Alpha-Gal -->Offer CT A/P with contrast (suspect will be low yield).  Dx: chronic diarrhea, abdominal pain generalized. -->I recommend he have OV with Dr. Marletta Lor (he has only seen the APPs in the office)

## 2022-06-28 ENCOUNTER — Other Ambulatory Visit: Payer: Self-pay

## 2022-06-28 DIAGNOSIS — R1084 Generalized abdominal pain: Secondary | ICD-10-CM

## 2022-06-28 DIAGNOSIS — R197 Diarrhea, unspecified: Secondary | ICD-10-CM

## 2022-06-28 NOTE — Telephone Encounter (Signed)
CT scheduled. PA approved via carelon Order ID: [id number]       Authorized  Approval Valid Through: 06/28/2022 - 08/26/2022

## 2022-06-28 NOTE — Addendum Note (Signed)
Addended by: Armstead Peaks on: 06/28/2022 09:34 AM   Modules accepted: Orders

## 2022-06-30 ENCOUNTER — Encounter: Payer: Self-pay | Admitting: Internal Medicine

## 2022-07-04 DIAGNOSIS — R112 Nausea with vomiting, unspecified: Secondary | ICD-10-CM | POA: Diagnosis not present

## 2022-07-04 DIAGNOSIS — K582 Mixed irritable bowel syndrome: Secondary | ICD-10-CM | POA: Diagnosis not present

## 2022-07-12 DIAGNOSIS — F411 Generalized anxiety disorder: Secondary | ICD-10-CM | POA: Diagnosis not present

## 2022-07-12 DIAGNOSIS — K582 Mixed irritable bowel syndrome: Secondary | ICD-10-CM | POA: Diagnosis not present

## 2022-07-12 DIAGNOSIS — Z23 Encounter for immunization: Secondary | ICD-10-CM | POA: Diagnosis not present

## 2022-07-27 ENCOUNTER — Ambulatory Visit: Payer: Medicaid Other | Admitting: Internal Medicine

## 2022-08-01 ENCOUNTER — Ambulatory Visit (HOSPITAL_COMMUNITY)
Admission: RE | Admit: 2022-08-01 | Discharge: 2022-08-01 | Disposition: A | Discharge status: Home / Self Care | Payer: Medicaid Other | Source: Ambulatory Visit | Attending: Gastroenterology | Admitting: Gastroenterology

## 2022-08-01 DIAGNOSIS — R1084 Generalized abdominal pain: Secondary | ICD-10-CM | POA: Insufficient documentation

## 2022-08-01 DIAGNOSIS — R197 Diarrhea, unspecified: Secondary | ICD-10-CM | POA: Diagnosis not present

## 2022-08-01 MED ORDER — IOHEXOL 300 MG/ML  SOLN
100.0000 mL | Freq: Once | INTRAMUSCULAR | Status: AC | PRN
  Administered 2022-08-01: 100 mL via INTRAVENOUS

## 2022-08-04 ENCOUNTER — Other Ambulatory Visit: Payer: Self-pay | Admitting: Gastroenterology

## 2022-08-05 NOTE — Telephone Encounter (Signed)
Pt request refill on Omeprazole 20 mg cap. Last ov was 05/03/2022 with you. Please advise

## 2022-08-08 NOTE — Telephone Encounter (Signed)
Phoned and advised the pt of her Rx being sent in. Pt expressed understanding

## 2022-08-17 ENCOUNTER — Ambulatory Visit: Payer: Medicaid Other | Admitting: Internal Medicine

## 2022-08-23 DIAGNOSIS — F4010 Social phobia, unspecified: Secondary | ICD-10-CM | POA: Diagnosis not present

## 2022-08-23 DIAGNOSIS — F411 Generalized anxiety disorder: Secondary | ICD-10-CM | POA: Diagnosis not present

## 2022-08-23 DIAGNOSIS — K582 Mixed irritable bowel syndrome: Secondary | ICD-10-CM | POA: Diagnosis not present

## 2022-08-23 DIAGNOSIS — M722 Plantar fascial fibromatosis: Secondary | ICD-10-CM | POA: Diagnosis not present

## 2022-09-29 ENCOUNTER — Ambulatory Visit: Payer: Medicaid Other | Admitting: Internal Medicine

## 2022-10-04 DIAGNOSIS — F411 Generalized anxiety disorder: Secondary | ICD-10-CM | POA: Diagnosis not present

## 2022-10-04 DIAGNOSIS — F4010 Social phobia, unspecified: Secondary | ICD-10-CM | POA: Diagnosis not present

## 2022-10-08 ENCOUNTER — Other Ambulatory Visit: Payer: Self-pay | Admitting: Gastroenterology

## 2022-10-08 DIAGNOSIS — K580 Irritable bowel syndrome with diarrhea: Secondary | ICD-10-CM

## 2022-10-08 DIAGNOSIS — R197 Diarrhea, unspecified: Secondary | ICD-10-CM

## 2022-10-12 NOTE — Telephone Encounter (Signed)
He no showed last two OVs with Dr. Marletta Lor.  I will refill his viberzi BUT he needs to make and keep an ov with Dr. Marletta Lor ONLY.  Please schedule.

## 2022-11-08 ENCOUNTER — Encounter: Payer: Self-pay | Admitting: Internal Medicine

## 2022-12-01 ENCOUNTER — Ambulatory Visit: Payer: Medicaid Other | Admitting: Internal Medicine

## 2022-12-12 ENCOUNTER — Other Ambulatory Visit: Payer: Self-pay | Admitting: Gastroenterology

## 2022-12-14 ENCOUNTER — Ambulatory Visit: Payer: Medicaid Other | Admitting: Internal Medicine

## 2022-12-27 DIAGNOSIS — H5213 Myopia, bilateral: Secondary | ICD-10-CM | POA: Diagnosis not present

## 2023-03-30 DIAGNOSIS — H5203 Hypermetropia, bilateral: Secondary | ICD-10-CM | POA: Diagnosis not present

## 2023-03-30 DIAGNOSIS — H52223 Regular astigmatism, bilateral: Secondary | ICD-10-CM | POA: Diagnosis not present

## 2023-04-25 DIAGNOSIS — K580 Irritable bowel syndrome with diarrhea: Secondary | ICD-10-CM | POA: Diagnosis not present

## 2023-04-25 DIAGNOSIS — F411 Generalized anxiety disorder: Secondary | ICD-10-CM | POA: Diagnosis not present

## 2023-04-25 DIAGNOSIS — Z0000 Encounter for general adult medical examination without abnormal findings: Secondary | ICD-10-CM | POA: Diagnosis not present

## 2023-04-25 DIAGNOSIS — Z68.41 Body mass index (BMI) pediatric, greater than or equal to 95th percentile for age: Secondary | ICD-10-CM | POA: Diagnosis not present

## 2023-04-25 DIAGNOSIS — T148XXA Other injury of unspecified body region, initial encounter: Secondary | ICD-10-CM | POA: Diagnosis not present

## 2023-04-25 DIAGNOSIS — E6609 Other obesity due to excess calories: Secondary | ICD-10-CM | POA: Diagnosis not present

## 2023-04-25 DIAGNOSIS — F5105 Insomnia due to other mental disorder: Secondary | ICD-10-CM | POA: Diagnosis not present

## 2023-04-25 DIAGNOSIS — F99 Mental disorder, not otherwise specified: Secondary | ICD-10-CM | POA: Diagnosis not present

## 2023-07-18 DIAGNOSIS — F5105 Insomnia due to other mental disorder: Secondary | ICD-10-CM | POA: Diagnosis not present

## 2023-07-18 DIAGNOSIS — M545 Low back pain, unspecified: Secondary | ICD-10-CM | POA: Diagnosis not present

## 2023-07-18 DIAGNOSIS — F411 Generalized anxiety disorder: Secondary | ICD-10-CM | POA: Diagnosis not present

## 2023-07-18 DIAGNOSIS — F99 Mental disorder, not otherwise specified: Secondary | ICD-10-CM | POA: Diagnosis not present

## 2023-08-13 ENCOUNTER — Other Ambulatory Visit: Payer: Self-pay | Admitting: Gastroenterology

## 2024-01-02 DIAGNOSIS — Z13220 Encounter for screening for lipoid disorders: Secondary | ICD-10-CM | POA: Diagnosis not present

## 2024-01-02 DIAGNOSIS — F411 Generalized anxiety disorder: Secondary | ICD-10-CM | POA: Diagnosis not present

## 2024-01-02 DIAGNOSIS — F410 Panic disorder [episodic paroxysmal anxiety] without agoraphobia: Secondary | ICD-10-CM | POA: Diagnosis not present

## 2024-01-02 DIAGNOSIS — Z0000 Encounter for general adult medical examination without abnormal findings: Secondary | ICD-10-CM | POA: Diagnosis not present

## 2024-01-02 DIAGNOSIS — Z136 Encounter for screening for cardiovascular disorders: Secondary | ICD-10-CM | POA: Diagnosis not present

## 2024-01-06 DIAGNOSIS — F330 Major depressive disorder, recurrent, mild: Secondary | ICD-10-CM | POA: Diagnosis not present

## 2024-01-06 DIAGNOSIS — F4011 Social phobia, generalized: Secondary | ICD-10-CM | POA: Diagnosis not present

## 2024-01-06 DIAGNOSIS — F411 Generalized anxiety disorder: Secondary | ICD-10-CM | POA: Diagnosis not present

## 2024-06-10 DIAGNOSIS — F900 Attention-deficit hyperactivity disorder, predominantly inattentive type: Secondary | ICD-10-CM | POA: Diagnosis not present

## 2024-06-10 DIAGNOSIS — F99 Mental disorder, not otherwise specified: Secondary | ICD-10-CM | POA: Diagnosis not present

## 2024-06-10 DIAGNOSIS — F411 Generalized anxiety disorder: Secondary | ICD-10-CM | POA: Diagnosis not present

## 2024-06-10 DIAGNOSIS — F5105 Insomnia due to other mental disorder: Secondary | ICD-10-CM | POA: Diagnosis not present

## 2024-06-10 DIAGNOSIS — F332 Major depressive disorder, recurrent severe without psychotic features: Secondary | ICD-10-CM | POA: Diagnosis not present

## 2024-07-12 DIAGNOSIS — F900 Attention-deficit hyperactivity disorder, predominantly inattentive type: Secondary | ICD-10-CM | POA: Diagnosis not present

## 2024-07-12 DIAGNOSIS — J3089 Other allergic rhinitis: Secondary | ICD-10-CM | POA: Diagnosis not present

## 2024-07-12 DIAGNOSIS — F411 Generalized anxiety disorder: Secondary | ICD-10-CM | POA: Diagnosis not present

## 2024-07-12 DIAGNOSIS — F332 Major depressive disorder, recurrent severe without psychotic features: Secondary | ICD-10-CM | POA: Diagnosis not present

## 2024-07-12 DIAGNOSIS — J302 Other seasonal allergic rhinitis: Secondary | ICD-10-CM | POA: Diagnosis not present

## 2024-09-09 ENCOUNTER — Emergency Department (HOSPITAL_COMMUNITY)

## 2024-09-09 ENCOUNTER — Encounter (HOSPITAL_COMMUNITY): Payer: Self-pay | Admitting: Emergency Medicine

## 2024-09-09 ENCOUNTER — Emergency Department (HOSPITAL_COMMUNITY)
Admission: EM | Admit: 2024-09-09 | Discharge: 2024-09-09 | Disposition: A | Discharge status: Home / Self Care | Attending: Emergency Medicine | Admitting: Emergency Medicine

## 2024-09-09 DIAGNOSIS — S80211A Abrasion, right knee, initial encounter: Secondary | ICD-10-CM | POA: Insufficient documentation

## 2024-09-09 DIAGNOSIS — S0990XA Unspecified injury of head, initial encounter: Secondary | ICD-10-CM | POA: Diagnosis not present

## 2024-09-09 DIAGNOSIS — S022XXA Fracture of nasal bones, initial encounter for closed fracture: Secondary | ICD-10-CM | POA: Diagnosis not present

## 2024-09-09 DIAGNOSIS — Z23 Encounter for immunization: Secondary | ICD-10-CM | POA: Diagnosis not present

## 2024-09-09 DIAGNOSIS — S50811A Abrasion of right forearm, initial encounter: Secondary | ICD-10-CM | POA: Insufficient documentation

## 2024-09-09 MED ORDER — TRIPLE ANTIBIOTIC 3.5-400-5000 EX OINT
1.0000 | TOPICAL_OINTMENT | Freq: Once | CUTANEOUS | Status: AC
  Administered 2024-09-09: 1 via CUTANEOUS
  Filled 2024-09-09: qty 1

## 2024-09-09 MED ORDER — IBUPROFEN 800 MG PO TABS
800.0000 mg | ORAL_TABLET | Freq: Three times a day (TID) | ORAL | 0 refills | Status: AC | PRN
Start: 2024-09-09 — End: ?

## 2024-09-09 MED ORDER — IBUPROFEN 800 MG PO TABS
800.0000 mg | ORAL_TABLET | Freq: Once | ORAL | Status: AC
  Administered 2024-09-09: 800 mg via ORAL
  Filled 2024-09-09: qty 1

## 2024-09-09 MED ORDER — TETANUS-DIPHTH-ACELL PERTUSSIS 5-2-15.5 LF-MCG/0.5 IM SUSP
0.5000 mL | Freq: Once | INTRAMUSCULAR | Status: AC
  Administered 2024-09-09: 0.5 mL via INTRAMUSCULAR
  Filled 2024-09-09: qty 0.5
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
## 2024-09-25 DIAGNOSIS — M25461 Effusion, right knee: Secondary | ICD-10-CM | POA: Diagnosis not present

## 2024-09-25 DIAGNOSIS — F99 Mental disorder, not otherwise specified: Secondary | ICD-10-CM | POA: Diagnosis not present

## 2024-09-25 DIAGNOSIS — F411 Generalized anxiety disorder: Secondary | ICD-10-CM | POA: Diagnosis not present

## 2024-09-25 DIAGNOSIS — S80211S Abrasion, right knee, sequela: Secondary | ICD-10-CM | POA: Diagnosis not present

## 2024-09-25 DIAGNOSIS — F4010 Social phobia, unspecified: Secondary | ICD-10-CM | POA: Diagnosis not present

## 2024-09-25 DIAGNOSIS — R634 Abnormal weight loss: Secondary | ICD-10-CM | POA: Diagnosis not present

## 2024-09-25 DIAGNOSIS — F5105 Insomnia due to other mental disorder: Secondary | ICD-10-CM | POA: Diagnosis not present
